# Patient Record
Sex: Male | Born: 1990 | Race: White | Hispanic: No | Marital: Single | Smoking: Never smoker
Health system: Southern US, Community
[De-identification: ages and names within clinical notes are randomized; demographics above are authoritative.]

---

## 2017-09-02 ENCOUNTER — Emergency Department (HOSPITAL_BASED_OUTPATIENT_CLINIC_OR_DEPARTMENT_OTHER)
Admission: EM | Admit: 2017-09-02 | Discharge: 2017-09-02 | Disposition: A | Discharge status: Home / Self Care | Payer: BLUE CROSS/BLUE SHIELD | Attending: Emergency Medicine | Admitting: Emergency Medicine

## 2017-09-02 ENCOUNTER — Emergency Department (HOSPITAL_BASED_OUTPATIENT_CLINIC_OR_DEPARTMENT_OTHER): Payer: BLUE CROSS/BLUE SHIELD

## 2017-09-02 ENCOUNTER — Encounter (HOSPITAL_BASED_OUTPATIENT_CLINIC_OR_DEPARTMENT_OTHER): Payer: Self-pay | Admitting: Emergency Medicine

## 2017-09-02 ENCOUNTER — Other Ambulatory Visit: Payer: Self-pay

## 2017-09-02 DIAGNOSIS — S61412A Laceration without foreign body of left hand, initial encounter: Secondary | ICD-10-CM | POA: Diagnosis not present

## 2017-09-02 DIAGNOSIS — W268XXA Contact with other sharp object(s), not elsewhere classified, initial encounter: Secondary | ICD-10-CM | POA: Diagnosis not present

## 2017-09-02 DIAGNOSIS — Y999 Unspecified external cause status: Secondary | ICD-10-CM | POA: Insufficient documentation

## 2017-09-02 DIAGNOSIS — Y9389 Activity, other specified: Secondary | ICD-10-CM | POA: Diagnosis not present

## 2017-09-02 DIAGNOSIS — Y929 Unspecified place or not applicable: Secondary | ICD-10-CM | POA: Insufficient documentation

## 2017-09-02 DIAGNOSIS — R030 Elevated blood-pressure reading, without diagnosis of hypertension: Secondary | ICD-10-CM | POA: Insufficient documentation

## 2017-09-02 DIAGNOSIS — Z23 Encounter for immunization: Secondary | ICD-10-CM | POA: Insufficient documentation

## 2017-09-02 MED ORDER — CEPHALEXIN 500 MG PO CAPS: 500.0000 mg | ORAL_CAPSULE | Freq: Four times a day (QID) | ORAL | 0 refills | Status: AC

## 2017-09-02 MED ORDER — TETANUS-DIPHTH-ACELL PERTUSSIS 5-2.5-18.5 LF-MCG/0.5 IM SUSP
0.5000 mL | Freq: Once | INTRAMUSCULAR | Status: AC
  Administered 2017-09-02: 0.5 mL via INTRAMUSCULAR
  Filled 2017-09-02: qty 0.5

## 2017-09-02 MED ORDER — IBUPROFEN 600 MG PO TABS: 600.0000 mg | ORAL_TABLET | Freq: Four times a day (QID) | ORAL | 0 refills | Status: AC | PRN

## 2017-09-02 MED ORDER — ACETAMINOPHEN 500 MG PO TABS
1000.0000 mg | ORAL_TABLET | Freq: Once | ORAL | Status: AC
  Administered 2017-09-02: 1000 mg via ORAL
  Filled 2017-09-02: qty 2

## 2017-09-02 MED ORDER — ACETAMINOPHEN 325 MG PO TABS: 650.0000 mg | ORAL_TABLET | Freq: Four times a day (QID) | ORAL | 0 refills | Status: AC | PRN

## 2017-09-02 MED ORDER — LIDOCAINE HCL (PF) 1 % IJ SOLN
15.0000 mL | Freq: Once | INTRAMUSCULAR | Status: AC
  Administered 2017-09-02: 15 mL
  Filled 2017-09-02: qty 15

## 2017-09-02 NOTE — ED Provider Notes (Signed)
[facility] EMERGENCY DEPARTMENT Provider Note   CSN: [id number] Arrival date & time: 09/02/17  1505     History   Chief Complaint Chief Complaint  Patient presents with  . Extremity Laceration    HPI Todd Mack is a 27 y.o. male.  HPI  Patient is a 27 year old male with no significant past medical history presenting for laceration of the left hand.  Patient reports that he was playing paint ball, when he tripped and his hand struck a metal stake.  Patient denies any wooden component to this.  Patient reports bleeding was controlled with compression, and all sensation intact distal to the injury.  Patient reports that last tetanus shot was approximately 7 to 8 years ago.  No history of immunocompromise status.  History reviewed. No pertinent past medical history.  There are no active problems to display for this patient.   History reviewed. No pertinent surgical history.      Home Medications    Prior to Admission medications   Not on File    Family History History reviewed. No pertinent family history.  Social History Social History   Tobacco Use  . Smoking status: Never Smoker  . Smokeless tobacco: Never Used  Substance Use Topics  . Alcohol use: Yes    Comment: occ  . Drug use: Never     Allergies   Patient has no known allergies.   Review of Systems Review of Systems  Musculoskeletal: Negative for joint swelling.  Skin: Positive for wound. Negative for color change.  Neurological: Negative for weakness and numbness.     Physical Exam Updated Vital Signs BP (!) 144/73 (BP Location: Right Arm)   Pulse 65   Temp 98.4 F (36.9 C) (Oral)   Resp 16   Ht 6\' 1"  (1.854 m)   Wt 86.2 kg (190 lb)   SpO2 98%   BMI 25.07 kg/m   Physical Exam  Constitutional: He appears well-developed and well-nourished. No distress.  Sitting comfortably in bed.  HENT:  Head: Normocephalic and atraumatic.  Eyes: Conjunctivae are normal. Right eye  exhibits no discharge. Left eye exhibits no discharge.  EOMs normal to gross examination.  Neck: Normal range of motion.  Cardiovascular: Normal rate and regular rhythm.  Intact, 2+ radial and ulnar pulse of left upper extremity.  Pulmonary/Chest:  Normal respiratory effort. Patient converses comfortably. No audible wheeze or stridor.  Abdominal: He exhibits no distension.  Musculoskeletal: Normal range of motion.  Patient exhibits a 3 cm laceration of the left upper of the thenar eminence of the left palm.  Minimal subcutaneous tissue exposure.  Sensation intact distally in all digital nerve distributions of the thumb and index finger.   Neurological: He is alert.  Cranial nerves intact to gross observation. Patient moves extremities without difficulty.  Skin: Skin is warm and dry. He is not diaphoretic.     Psychiatric: He has a normal mood and affect. His behavior is normal. Judgment and thought content normal.  Nursing note and vitals reviewed.    ED Treatments / Results  Labs (all labs ordered are listed, but only abnormal results are displayed) Labs Reviewed - No data to display  EKG None  Radiology Dg Hand Complete Left  Result Date: 09/02/2017 CLINICAL DATA:  Deep abrasion on hand at the base of the thumb. EXAM: LEFT HAND - COMPLETE 3+ VIEW COMPARISON:  None. FINDINGS: No fractures. Slight increased density in the soft tissues between the first and second metacarpals on the oblique  view only is likely the site of puncture with some hematoma. No convincing evidence of foreign body. IMPRESSION: No fracture or foreign body identified. Electronically Signed   By: Gerome Sam III M.D   On: 09/02/2017 17:08    Procedures .Marland KitchenLaceration Repair Date/Time: 09/02/2017 6:30 PM Performed by: Elisha Ponder, PA-C Authorized by: Elisha Ponder, PA-C   Consent:    Consent obtained:  Verbal   Consent given by:  Patient   Risks discussed:  Infection, pain, need for additional  repair and poor cosmetic result Laceration details:    Location:  Hand   Hand location:  L palm   Length (cm):  3 Repair type:    Repair type:  Simple Pre-procedure details:    Preparation:  Patient was prepped and draped in usual sterile fashion Exploration:    Wound exploration: wound explored through full range of motion     Contaminated: no   Treatment:    Area cleansed with:  Saline   Irrigation solution:  Tap water   Irrigation volume:  5 minutes under direct pressure of tap Skin repair:    Repair method:  Sutures   Suture size:  4-0   Wound skin closure material used: Vicryl rapide.   Number of sutures:  3 Approximation:    Approximation:  Close Post-procedure details:    Dressing:  Non-adherent dressing   Patient tolerance of procedure:  Tolerated well, no immediate complications   (including critical care time)  Medications Ordered in ED Medications  acetaminophen (TYLENOL) tablet 1,000 mg (1,000 mg Oral Given 09/02/17 1627)  lidocaine (PF) (XYLOCAINE) 1 % injection 15 mL (15 mLs Infiltration Given by Other 09/02/17 1628)  Tdap (BOOSTRIX) injection 0.5 mL (0.5 mLs Intramuscular Given 09/02/17 1712)     Initial Impression / Assessment and Plan / ED Course  I have reviewed the triage vital signs and the nursing notes.  Pertinent labs & imaging results that were available during my care of the patient were reviewed by me and considered in my medical decision making (see chart for details).     Patient well-appearing in no acute distress.  Patient is neurovascularly intact in the left hand.  Patient has gaping wound  requiring primary closure.  No foreign bodies identified on x-ray, and wound explored through full range of motion.  Primary closure performed with Vicryl repeat, but encourage patient that the sutures should be removed in 7 to 10 days.  Patient to follow-up at his home PCP for this.  Patient given return precautions for any spreading redness, drainage, or  increasing swelling.  Patient is understanding and agrees with plan of care.  Final Clinical Impressions(s) / ED Diagnoses   Final diagnoses:  Laceration of left palm, initial encounter  Elevated blood pressure reading without diagnosis of hypertension    ED Discharge Orders        Ordered    cephALEXin (KEFLEX) 500 MG capsule  4 times daily     09/02/17 1832       Delia Chimes 09/02/17 1839    Rolan Bucco, MD 09/02/17 [phone]

## 2017-09-02 NOTE — ED Triage Notes (Signed)
Patient states that he was diving and his his left hand on a metal stake. The patient has a dressing htat is clean and intact. Bleeding is controlled

## 2017-09-02 NOTE — Discharge Instructions (Addendum)
Please see the information and instructions below regarding your visit.  Your diagnoses today include:  1. Laceration of left palm, initial encounter     Tests performed today include: X-ray of the affected area that did not show any foreign bodies or broken bones Vital signs. See below for your results today.   Medications prescribed:   Take any prescribed medications only as directed.  Ibuprofen alternating with Tylenol for pain.   Home care instructions:  Follow any educational materials and wound care instructions contained in this packet.   Keep affected area above the level of your heart when possible to minimize swelling. Wash area gently twice a day with warm soapy water. Do not apply alcohol or hydrogen peroxide directly over a wound. Cover the area if it is draining or weeping. Keep the bandage in place for 24 hours and refrain from getting the wound wet for 24 hours. After that, you may get the area wet, but please ensure that you dry it completely afterwards.  Please refrain from soaking sutures for long periods of time, or swimming in chlorinated water   You may apply antibiotic ointment such as Bacitracin or Neosporin.  Follow-up instructions: Suture Removal: Return to the Emergency Department or see your primary care care doctor in 7-10 days for a recheck of your wound and removal of your sutures or staples.    Return instructions:  Return to the Emergency Department if you have: Fever Worsening pain Worsening swelling of the wound Pus draining from the wound Redness of the skin that moves away from the wound, especially if it streaks away from the affected area  Any other emergent concerns  Your vital signs today were: BP (!) 144/73 (BP Location: Right Arm)    Pulse 65    Temp 98.4 F (36.9 C) (Oral)    Resp 16    Ht 6\' 1"  (1.854 m)    Wt 86.2 kg (190 lb)    SpO2 98%    BMI 25.07 kg/m  If your blood pressure (BP) was elevated on multiple readings during this  visit above 130 for the top number or above 80 for the bottom number, please have this repeated by your primary care provider within one month. --------------  Thank you for allowing us to participate in your care today! It was a pleasure taking care of you.

## 2017-09-02 NOTE — ED Notes (Signed)
Patient transported to X-ray 

## 2019-05-30 IMAGING — DX DG HAND COMPLETE 3+V*L*
3 series · 3 of 3 positions shown · non-contrast
Comparison: None.

CLINICAL DATA: Deep abrasion on hand at the base of the thumb.

EXAM:
LEFT HAND - COMPLETE 3+ VIEW

[hand pa]
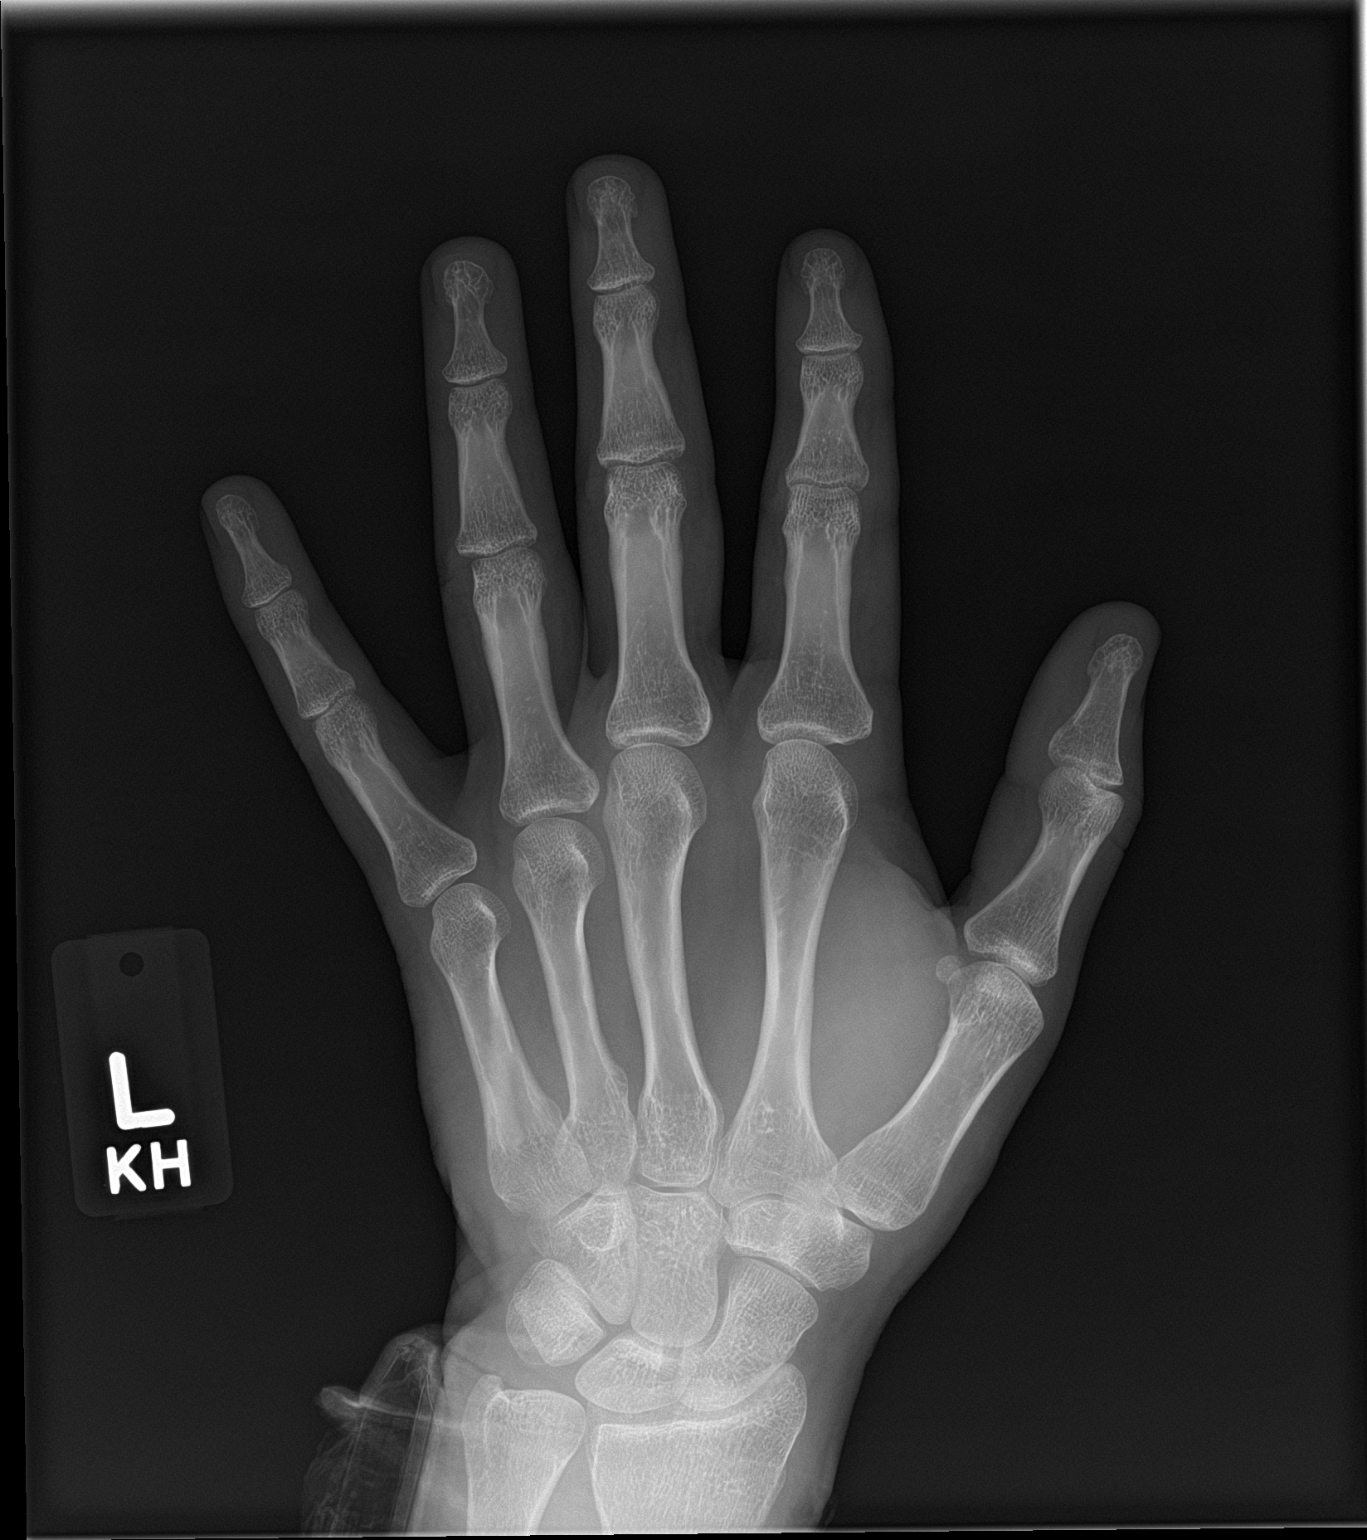

[hand obl]
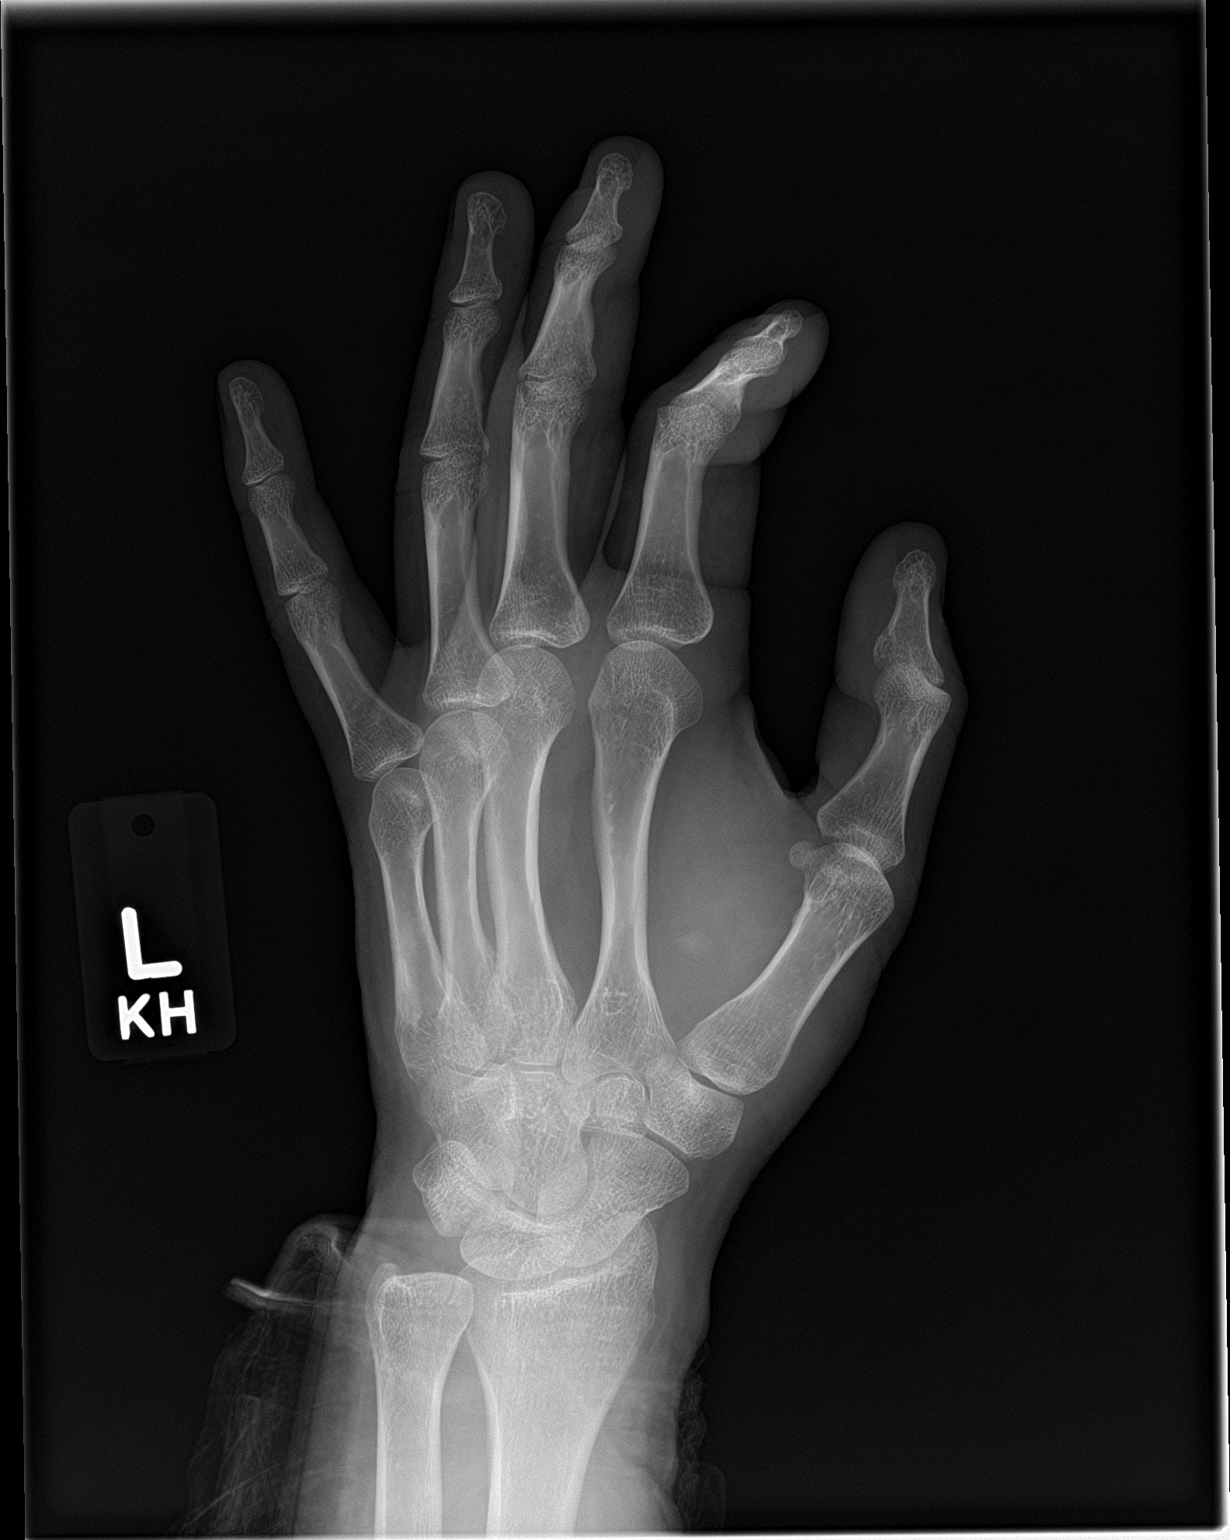

[hand lat]
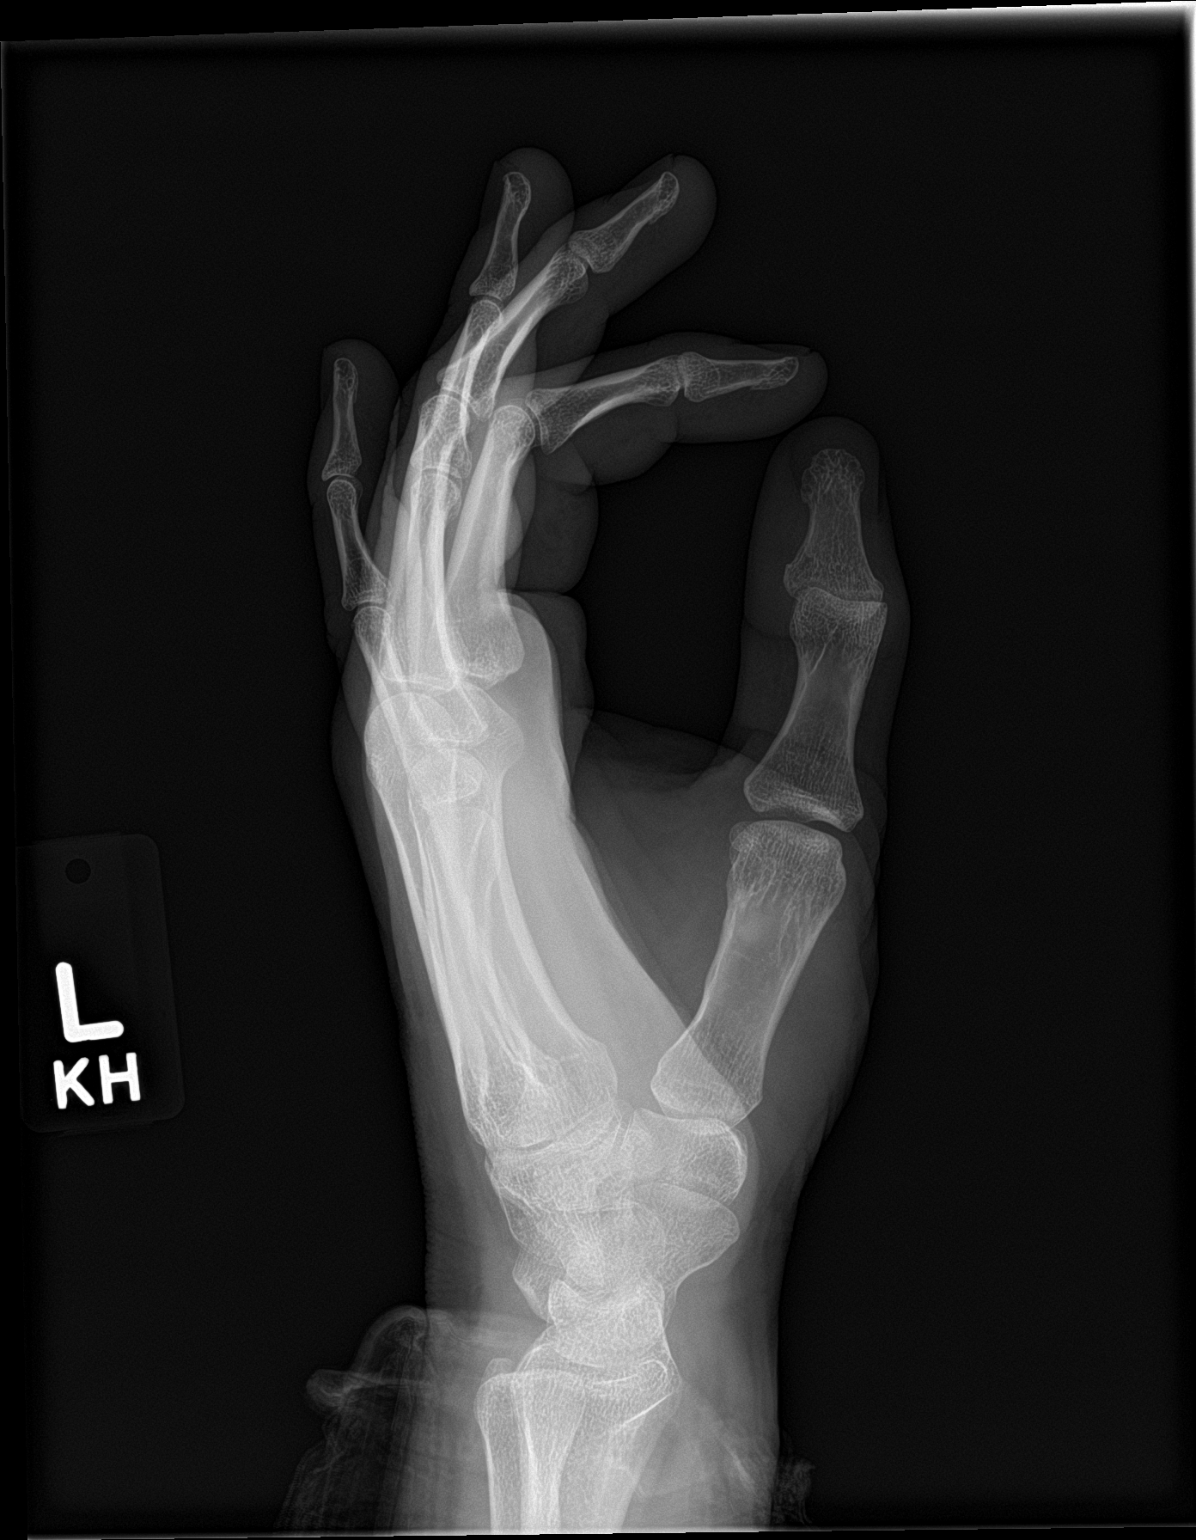

[3 of 3 positions shown; findings below may reference images not displayed]

FINDINGS: No fractures. Slight increased density in the soft tissues between
the first and second metacarpals on the oblique view only is likely
the site of puncture with some hematoma. No convincing evidence of
foreign body.
IMPRESSION: No fracture or foreign body identified.
